# Patient Record
Sex: Male | Born: 1970 | Race: White | Hispanic: No | Marital: Married | Smoking: Current every day smoker
Health system: Southern US, Community
[De-identification: ages and names within clinical notes are randomized; demographics above are authoritative.]

---

## 2014-08-16 ENCOUNTER — Emergency Department: Payer: Self-pay | Admitting: Student

## 2016-04-03 IMAGING — CR RIGHT ANKLE - COMPLETE 3+ VIEW
1 series · 3 of 3 positions shown · non-contrast
Comparison: None.

CLINICAL DATA: Right ankle infection. Ulceration over the lateral
malleolus.

EXAM:
RIGHT ANKLE - COMPLETE 3+ VIEW

[Series 1: ap · 0.17mm/px · 3 of 3 slices shown]
[im 1/3]
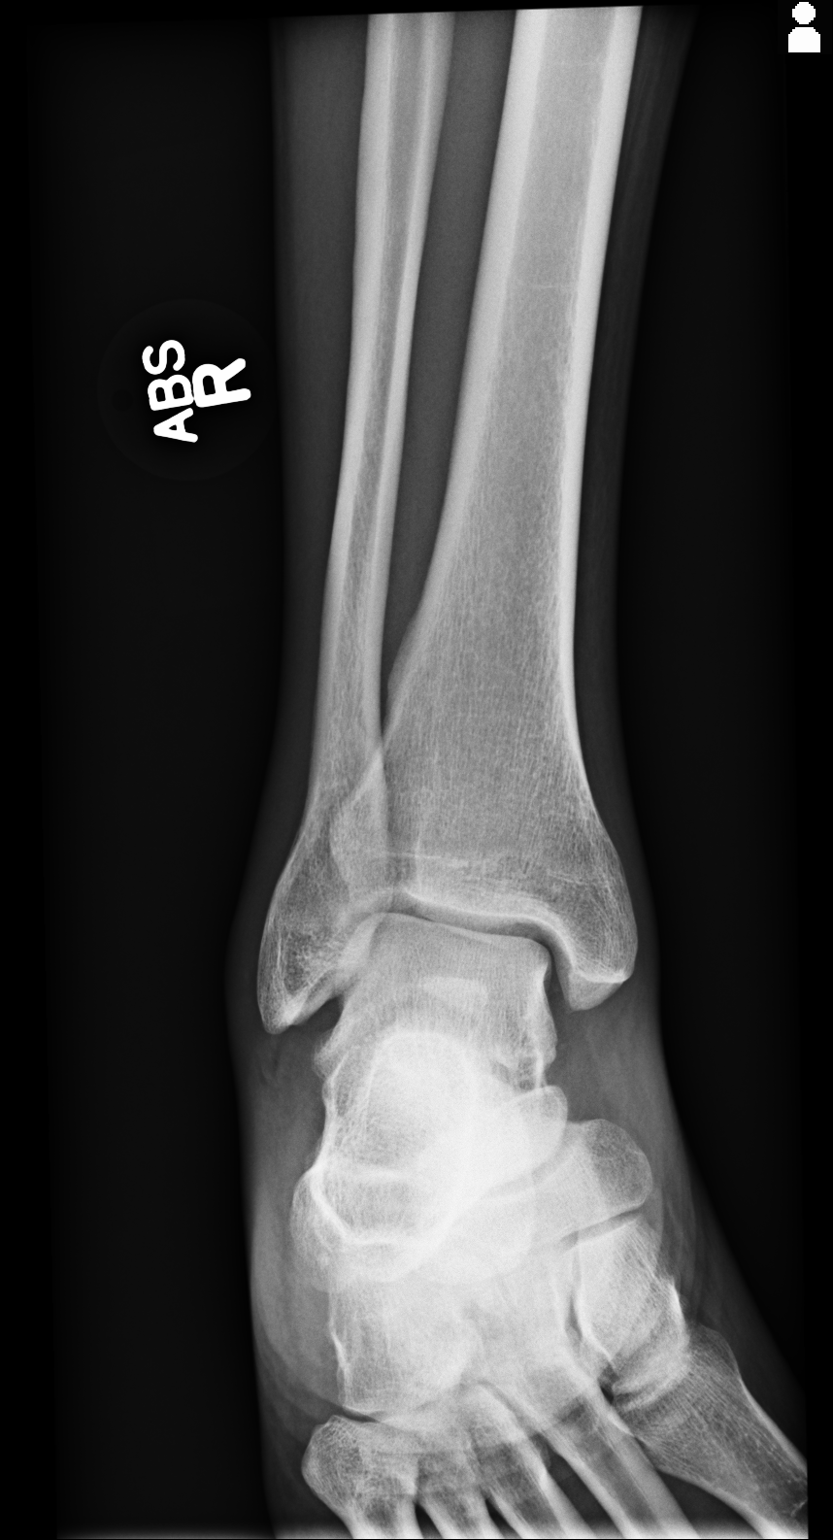
[im 2/3]
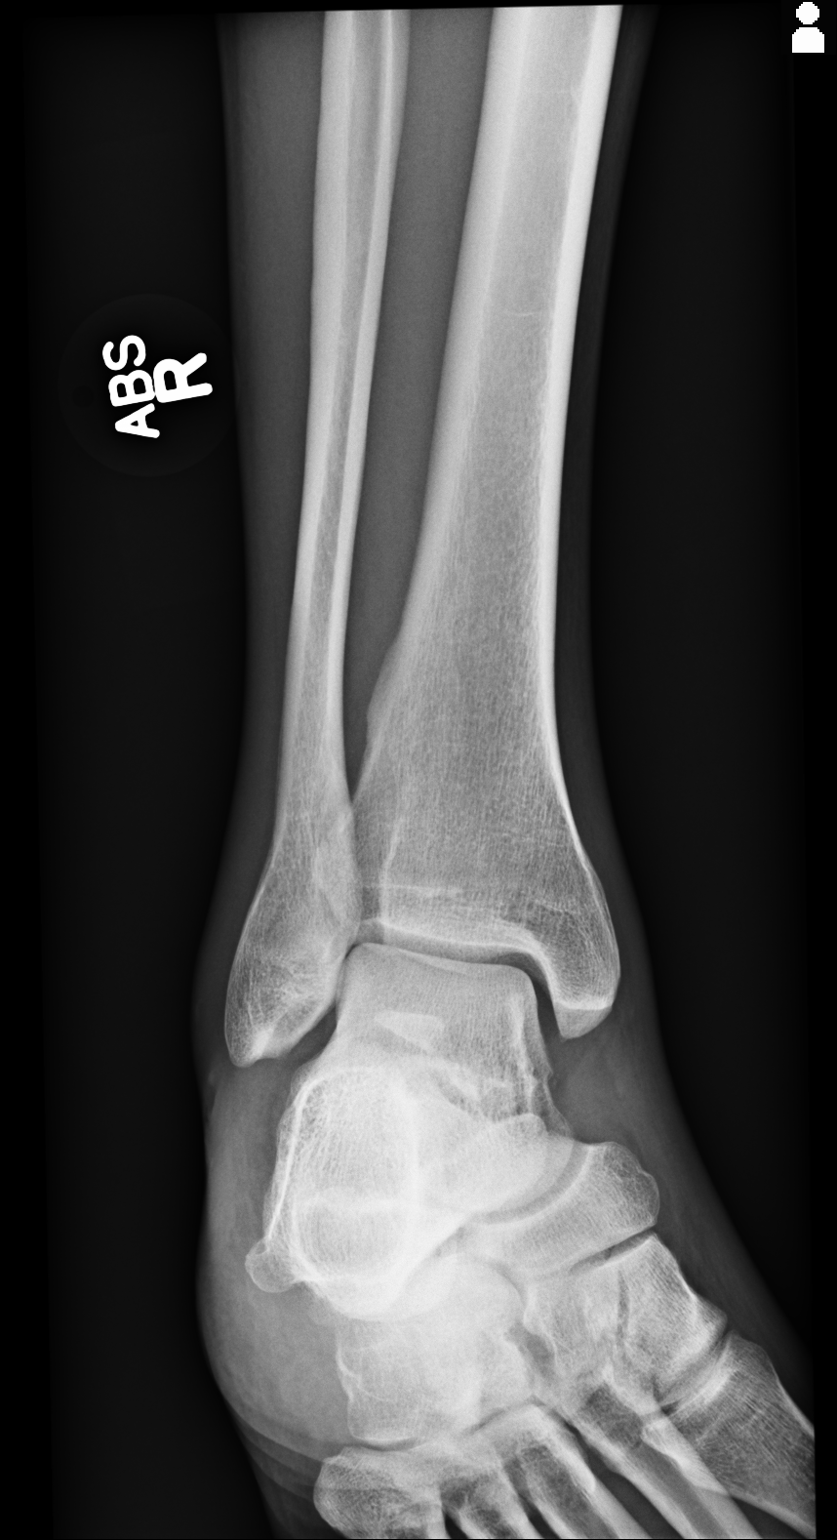
[im 3/3]
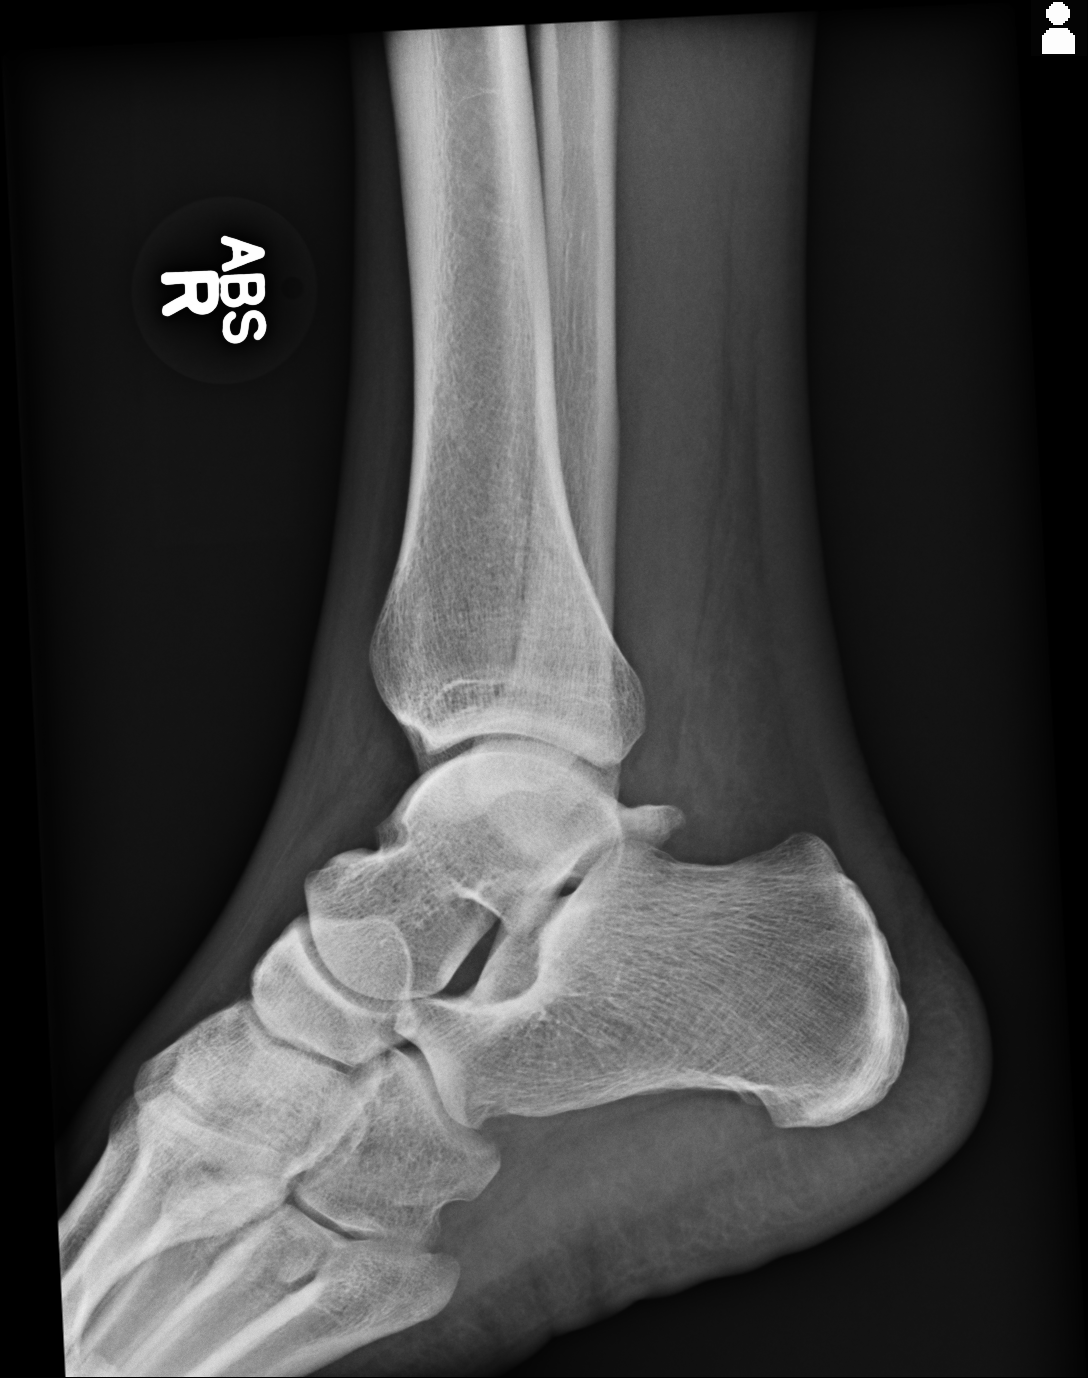

[3 of 3 positions shown; findings below may reference images not displayed]

FINDINGS: Soft tissue swelling is present over the lateral malleolus. There is
a focal ulceration. The underlying bone is intact. The ankle joint
is located. There is no osseous erosion.
IMPRESSION: 1. Soft tissue swelling and ulceration over the lateral malleolus
without osseous erosion or fracture.
2. No radiopaque foreign body.

## 2017-11-29 ENCOUNTER — Other Ambulatory Visit: Payer: Self-pay

## 2017-11-29 ENCOUNTER — Emergency Department
Admission: EM | Admit: 2017-11-29 | Discharge: 2017-11-29 | Disposition: A | Discharge status: Home / Self Care | Payer: Self-pay | Attending: Emergency Medicine | Admitting: Emergency Medicine

## 2017-11-29 ENCOUNTER — Encounter: Payer: Self-pay | Admitting: Emergency Medicine

## 2017-11-29 DIAGNOSIS — Y998 Other external cause status: Secondary | ICD-10-CM | POA: Insufficient documentation

## 2017-11-29 DIAGNOSIS — F17210 Nicotine dependence, cigarettes, uncomplicated: Secondary | ICD-10-CM | POA: Insufficient documentation

## 2017-11-29 DIAGNOSIS — Y939 Activity, unspecified: Secondary | ICD-10-CM | POA: Insufficient documentation

## 2017-11-29 DIAGNOSIS — S46812A Strain of other muscles, fascia and tendons at shoulder and upper arm level, left arm, initial encounter: Secondary | ICD-10-CM | POA: Insufficient documentation

## 2017-11-29 DIAGNOSIS — Y929 Unspecified place or not applicable: Secondary | ICD-10-CM | POA: Insufficient documentation

## 2017-11-29 MED ORDER — CYCLOBENZAPRINE HCL 10 MG PO TABS: 10.0000 mg | ORAL_TABLET | Freq: Three times a day (TID) | ORAL | 0 refills | Status: AC | PRN

## 2017-11-29 NOTE — Discharge Instructions (Signed)
Follow-up with the primary care provider of your choice for symptoms that are not improving over the week. Return to the emergency department for symptoms that change or worsen if you are unable to schedule appointment.
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
## 2017-11-29 NOTE — ED Notes (Signed)
See triage note  States he woke up with a stiff neck on Saturday  States min relief with IBU
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
